# Patient Record
Sex: Female | Born: 1995 | Race: White | Hispanic: No | Marital: Single | Smoking: Current every day smoker
Health system: Southern US, Community
[De-identification: ages and names within clinical notes are randomized; demographics above are authoritative.]

## PROBLEM LIST (undated history)

## (undated) HISTORY — PX: CHOLECYSTECTOMY: SHX55

---

## 2018-07-20 ENCOUNTER — Emergency Department (HOSPITAL_BASED_OUTPATIENT_CLINIC_OR_DEPARTMENT_OTHER): Payer: No Typology Code available for payment source

## 2018-07-20 ENCOUNTER — Encounter (HOSPITAL_BASED_OUTPATIENT_CLINIC_OR_DEPARTMENT_OTHER): Payer: Self-pay

## 2018-07-20 ENCOUNTER — Emergency Department (HOSPITAL_BASED_OUTPATIENT_CLINIC_OR_DEPARTMENT_OTHER)
Admission: EM | Admit: 2018-07-20 | Discharge: 2018-07-20 | Disposition: A | Discharge status: Home / Self Care | Payer: No Typology Code available for payment source | Attending: Emergency Medicine | Admitting: Emergency Medicine

## 2018-07-20 ENCOUNTER — Other Ambulatory Visit: Payer: Self-pay

## 2018-07-20 DIAGNOSIS — S299XXA Unspecified injury of thorax, initial encounter: Secondary | ICD-10-CM | POA: Diagnosis present

## 2018-07-20 DIAGNOSIS — Y92410 Unspecified street and highway as the place of occurrence of the external cause: Secondary | ICD-10-CM | POA: Diagnosis not present

## 2018-07-20 DIAGNOSIS — Y999 Unspecified external cause status: Secondary | ICD-10-CM | POA: Diagnosis not present

## 2018-07-20 DIAGNOSIS — F17210 Nicotine dependence, cigarettes, uncomplicated: Secondary | ICD-10-CM | POA: Diagnosis not present

## 2018-07-20 DIAGNOSIS — Y9389 Activity, other specified: Secondary | ICD-10-CM | POA: Diagnosis not present

## 2018-07-20 DIAGNOSIS — S20219A Contusion of unspecified front wall of thorax, initial encounter: Secondary | ICD-10-CM | POA: Insufficient documentation

## 2018-07-20 MED ORDER — METHOCARBAMOL 500 MG PO TABS: 500.0000 mg | ORAL_TABLET | Freq: Two times a day (BID) | ORAL | 0 refills | Status: AC

## 2018-07-20 MED ORDER — NAPROXEN 375 MG PO TABS: 375.0000 mg | ORAL_TABLET | Freq: Two times a day (BID) | ORAL | 0 refills | Status: AC

## 2018-07-20 NOTE — ED Notes (Signed)
Patient transported to X-ray 
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
## 2021-02-19 ENCOUNTER — Emergency Department: Admit: 2021-02-19 | Discharge status: Absent without leave | Payer: Self-pay

## 2023-07-01 ENCOUNTER — Other Ambulatory Visit (HOSPITAL_BASED_OUTPATIENT_CLINIC_OR_DEPARTMENT_OTHER): Payer: Self-pay | Admitting: Certified Nurse Midwife
